# Patient Record
Sex: Female | Born: 1935
Health system: Southern US, Community
[De-identification: ages and names within clinical notes are randomized; demographics above are authoritative.]

---

## 2011-05-24 DIAGNOSIS — I10 Essential (primary) hypertension: Secondary | ICD-10-CM | POA: Diagnosis not present

## 2011-05-24 DIAGNOSIS — I059 Rheumatic mitral valve disease, unspecified: Secondary | ICD-10-CM | POA: Diagnosis not present

## 2011-05-24 DIAGNOSIS — E785 Hyperlipidemia, unspecified: Secondary | ICD-10-CM | POA: Diagnosis not present

## 2011-05-24 DIAGNOSIS — I6529 Occlusion and stenosis of unspecified carotid artery: Secondary | ICD-10-CM | POA: Diagnosis not present

## 2011-06-01 DIAGNOSIS — I6529 Occlusion and stenosis of unspecified carotid artery: Secondary | ICD-10-CM | POA: Diagnosis not present

## 2011-06-14 DIAGNOSIS — I10 Essential (primary) hypertension: Secondary | ICD-10-CM | POA: Diagnosis not present

## 2011-06-14 DIAGNOSIS — Z0000 Encounter for general adult medical examination without abnormal findings: Secondary | ICD-10-CM | POA: Diagnosis not present

## 2011-06-14 DIAGNOSIS — E7800 Pure hypercholesterolemia, unspecified: Secondary | ICD-10-CM | POA: Diagnosis not present

## 2011-06-14 DIAGNOSIS — H9190 Unspecified hearing loss, unspecified ear: Secondary | ICD-10-CM | POA: Diagnosis not present

## 2011-06-22 DIAGNOSIS — N390 Urinary tract infection, site not specified: Secondary | ICD-10-CM | POA: Diagnosis not present

## 2011-06-22 DIAGNOSIS — E785 Hyperlipidemia, unspecified: Secondary | ICD-10-CM | POA: Diagnosis not present

## 2011-06-22 DIAGNOSIS — Z79899 Other long term (current) drug therapy: Secondary | ICD-10-CM | POA: Diagnosis not present

## 2011-06-22 DIAGNOSIS — E559 Vitamin D deficiency, unspecified: Secondary | ICD-10-CM | POA: Diagnosis not present

## 2011-06-22 DIAGNOSIS — I10 Essential (primary) hypertension: Secondary | ICD-10-CM | POA: Diagnosis not present

## 2011-06-22 DIAGNOSIS — R110 Nausea: Secondary | ICD-10-CM | POA: Diagnosis not present

## 2011-06-22 DIAGNOSIS — R630 Anorexia: Secondary | ICD-10-CM | POA: Diagnosis not present

## 2011-06-22 DIAGNOSIS — R5381 Other malaise: Secondary | ICD-10-CM | POA: Diagnosis not present

## 2011-06-26 DIAGNOSIS — R5383 Other fatigue: Secondary | ICD-10-CM | POA: Diagnosis not present

## 2011-06-26 DIAGNOSIS — R5381 Other malaise: Secondary | ICD-10-CM | POA: Diagnosis not present

## 2011-06-26 DIAGNOSIS — R3915 Urgency of urination: Secondary | ICD-10-CM | POA: Diagnosis not present

## 2011-06-26 DIAGNOSIS — I10 Essential (primary) hypertension: Secondary | ICD-10-CM | POA: Diagnosis not present

## 2011-07-06 DIAGNOSIS — Z961 Presence of intraocular lens: Secondary | ICD-10-CM | POA: Diagnosis not present

## 2011-07-06 DIAGNOSIS — H353290 Exudative age-related macular degeneration, unspecified eye, stage unspecified: Secondary | ICD-10-CM | POA: Diagnosis not present

## 2012-10-24 DIAGNOSIS — F17200 Nicotine dependence, unspecified, uncomplicated: Secondary | ICD-10-CM | POA: Diagnosis not present

## 2012-10-24 DIAGNOSIS — R413 Other amnesia: Secondary | ICD-10-CM | POA: Diagnosis not present

## 2012-10-24 DIAGNOSIS — E785 Hyperlipidemia, unspecified: Secondary | ICD-10-CM | POA: Diagnosis not present

## 2012-10-24 DIAGNOSIS — R0989 Other specified symptoms and signs involving the circulatory and respiratory systems: Secondary | ICD-10-CM | POA: Diagnosis not present

## 2012-10-24 DIAGNOSIS — Z681 Body mass index (BMI) 19 or less, adult: Secondary | ICD-10-CM | POA: Diagnosis not present

## 2012-10-24 DIAGNOSIS — I10 Essential (primary) hypertension: Secondary | ICD-10-CM | POA: Diagnosis not present

## 2012-10-24 DIAGNOSIS — Z1331 Encounter for screening for depression: Secondary | ICD-10-CM | POA: Diagnosis not present

## 2012-10-24 DIAGNOSIS — H3530 Unspecified macular degeneration: Secondary | ICD-10-CM | POA: Diagnosis not present

## 2012-10-30 ENCOUNTER — Other Ambulatory Visit: Payer: Self-pay

## 2012-11-01 ENCOUNTER — Other Ambulatory Visit (INDEPENDENT_AMBULATORY_CARE_PROVIDER_SITE_OTHER): Payer: Medicare Other | Admitting: Vascular Surgery

## 2012-11-01 DIAGNOSIS — R0989 Other specified symptoms and signs involving the circulatory and respiratory systems: Secondary | ICD-10-CM

## 2012-11-04 ENCOUNTER — Encounter: Payer: Self-pay | Admitting: Internal Medicine

## 2012-12-17 DIAGNOSIS — G3184 Mild cognitive impairment, so stated: Secondary | ICD-10-CM | POA: Diagnosis not present

## 2012-12-17 DIAGNOSIS — R413 Other amnesia: Secondary | ICD-10-CM | POA: Diagnosis not present

## 2012-12-24 ENCOUNTER — Other Ambulatory Visit: Payer: Self-pay | Admitting: *Deleted

## 2012-12-24 DIAGNOSIS — G3184 Mild cognitive impairment, so stated: Secondary | ICD-10-CM

## 2012-12-24 DIAGNOSIS — R413 Other amnesia: Secondary | ICD-10-CM

## 2012-12-26 ENCOUNTER — Ambulatory Visit
Admission: RE | Admit: 2012-12-26 | Discharge: 2012-12-26 | Disposition: A | Discharge status: Home / Self Care | Payer: Medicare Other | Source: Ambulatory Visit | Attending: *Deleted | Admitting: *Deleted

## 2012-12-26 DIAGNOSIS — R413 Other amnesia: Secondary | ICD-10-CM

## 2012-12-26 DIAGNOSIS — G3184 Mild cognitive impairment, so stated: Secondary | ICD-10-CM

## 2013-01-21 DIAGNOSIS — R413 Other amnesia: Secondary | ICD-10-CM | POA: Diagnosis not present

## 2013-01-21 DIAGNOSIS — G3184 Mild cognitive impairment, so stated: Secondary | ICD-10-CM | POA: Diagnosis not present

## 2013-01-21 DIAGNOSIS — I10 Essential (primary) hypertension: Secondary | ICD-10-CM | POA: Diagnosis not present

## 2013-01-21 DIAGNOSIS — R4181 Age-related cognitive decline: Secondary | ICD-10-CM | POA: Diagnosis not present

## 2013-01-21 DIAGNOSIS — E7800 Pure hypercholesterolemia, unspecified: Secondary | ICD-10-CM | POA: Diagnosis not present

## 2013-01-21 DIAGNOSIS — F17200 Nicotine dependence, unspecified, uncomplicated: Secondary | ICD-10-CM | POA: Diagnosis not present

## 2013-04-10 DIAGNOSIS — R82998 Other abnormal findings in urine: Secondary | ICD-10-CM | POA: Diagnosis not present

## 2013-04-10 DIAGNOSIS — R809 Proteinuria, unspecified: Secondary | ICD-10-CM | POA: Diagnosis not present

## 2013-04-10 DIAGNOSIS — I10 Essential (primary) hypertension: Secondary | ICD-10-CM | POA: Diagnosis not present

## 2013-04-10 DIAGNOSIS — E785 Hyperlipidemia, unspecified: Secondary | ICD-10-CM | POA: Diagnosis not present

## 2013-04-16 DIAGNOSIS — I10 Essential (primary) hypertension: Secondary | ICD-10-CM | POA: Diagnosis not present

## 2013-04-16 DIAGNOSIS — Z23 Encounter for immunization: Secondary | ICD-10-CM | POA: Diagnosis not present

## 2013-04-16 DIAGNOSIS — Z0000 Encounter for general adult medical examination without abnormal findings: Secondary | ICD-10-CM | POA: Diagnosis not present

## 2013-04-16 DIAGNOSIS — N183 Chronic kidney disease, stage 3 unspecified: Secondary | ICD-10-CM | POA: Diagnosis not present

## 2013-04-16 DIAGNOSIS — R413 Other amnesia: Secondary | ICD-10-CM | POA: Diagnosis not present

## 2013-04-16 DIAGNOSIS — F17200 Nicotine dependence, unspecified, uncomplicated: Secondary | ICD-10-CM | POA: Diagnosis not present

## 2013-04-16 DIAGNOSIS — H3530 Unspecified macular degeneration: Secondary | ICD-10-CM | POA: Diagnosis not present

## 2013-04-16 DIAGNOSIS — E785 Hyperlipidemia, unspecified: Secondary | ICD-10-CM | POA: Diagnosis not present

## 2013-07-29 DIAGNOSIS — F0390 Unspecified dementia without behavioral disturbance: Secondary | ICD-10-CM | POA: Diagnosis not present

## 2013-07-29 DIAGNOSIS — F17200 Nicotine dependence, unspecified, uncomplicated: Secondary | ICD-10-CM | POA: Diagnosis not present

## 2013-10-22 DIAGNOSIS — F0390 Unspecified dementia without behavioral disturbance: Secondary | ICD-10-CM | POA: Diagnosis not present

## 2013-10-22 DIAGNOSIS — E785 Hyperlipidemia, unspecified: Secondary | ICD-10-CM | POA: Diagnosis not present

## 2013-10-22 DIAGNOSIS — K219 Gastro-esophageal reflux disease without esophagitis: Secondary | ICD-10-CM | POA: Diagnosis not present

## 2013-10-22 DIAGNOSIS — I10 Essential (primary) hypertension: Secondary | ICD-10-CM | POA: Diagnosis not present

## 2013-10-28 DIAGNOSIS — N390 Urinary tract infection, site not specified: Secondary | ICD-10-CM | POA: Diagnosis not present

## 2013-10-28 DIAGNOSIS — E785 Hyperlipidemia, unspecified: Secondary | ICD-10-CM | POA: Diagnosis not present

## 2013-10-28 DIAGNOSIS — I10 Essential (primary) hypertension: Secondary | ICD-10-CM | POA: Diagnosis not present

## 2013-10-28 DIAGNOSIS — E559 Vitamin D deficiency, unspecified: Secondary | ICD-10-CM | POA: Diagnosis not present

## 2013-11-06 DIAGNOSIS — H353290 Exudative age-related macular degeneration, unspecified eye, stage unspecified: Secondary | ICD-10-CM | POA: Diagnosis not present

## 2013-11-06 DIAGNOSIS — H353190 Nonexudative age-related macular degeneration, unspecified eye, stage unspecified: Secondary | ICD-10-CM | POA: Diagnosis not present

## 2013-11-06 DIAGNOSIS — Z961 Presence of intraocular lens: Secondary | ICD-10-CM | POA: Diagnosis not present

## 2014-01-08 DIAGNOSIS — H3532 Exudative age-related macular degeneration: Secondary | ICD-10-CM | POA: Diagnosis not present

## 2014-01-08 DIAGNOSIS — H3531 Nonexudative age-related macular degeneration: Secondary | ICD-10-CM | POA: Diagnosis not present

## 2014-04-07 DIAGNOSIS — Z1389 Encounter for screening for other disorder: Secondary | ICD-10-CM | POA: Diagnosis not present

## 2014-04-07 DIAGNOSIS — Z23 Encounter for immunization: Secondary | ICD-10-CM | POA: Diagnosis not present

## 2014-04-07 DIAGNOSIS — Z681 Body mass index (BMI) 19 or less, adult: Secondary | ICD-10-CM | POA: Diagnosis not present

## 2014-04-07 DIAGNOSIS — N183 Chronic kidney disease, stage 3 (moderate): Secondary | ICD-10-CM | POA: Diagnosis not present

## 2014-04-07 DIAGNOSIS — I10 Essential (primary) hypertension: Secondary | ICD-10-CM | POA: Diagnosis not present

## 2014-04-07 DIAGNOSIS — F0280 Dementia in other diseases classified elsewhere without behavioral disturbance: Secondary | ICD-10-CM | POA: Diagnosis not present

## 2015-01-14 DIAGNOSIS — H905 Unspecified sensorineural hearing loss: Secondary | ICD-10-CM | POA: Diagnosis not present

## 2015-01-14 DIAGNOSIS — I10 Essential (primary) hypertension: Secondary | ICD-10-CM | POA: Diagnosis not present

## 2015-01-14 DIAGNOSIS — E785 Hyperlipidemia, unspecified: Secondary | ICD-10-CM | POA: Diagnosis not present

## 2015-01-14 DIAGNOSIS — R05 Cough: Secondary | ICD-10-CM | POA: Diagnosis not present

## 2015-01-14 DIAGNOSIS — F0390 Unspecified dementia without behavioral disturbance: Secondary | ICD-10-CM | POA: Diagnosis not present

## 2015-01-21 DIAGNOSIS — F17210 Nicotine dependence, cigarettes, uncomplicated: Secondary | ICD-10-CM | POA: Diagnosis not present

## 2015-01-21 DIAGNOSIS — R05 Cough: Secondary | ICD-10-CM | POA: Diagnosis not present

## 2015-02-15 DIAGNOSIS — E785 Hyperlipidemia, unspecified: Secondary | ICD-10-CM | POA: Diagnosis not present

## 2015-02-15 DIAGNOSIS — I10 Essential (primary) hypertension: Secondary | ICD-10-CM | POA: Diagnosis not present

## 2015-02-15 DIAGNOSIS — F0390 Unspecified dementia without behavioral disturbance: Secondary | ICD-10-CM | POA: Diagnosis not present

## 2015-02-22 DIAGNOSIS — H353210 Exudative age-related macular degeneration, right eye, stage unspecified: Secondary | ICD-10-CM | POA: Diagnosis not present

## 2015-02-22 DIAGNOSIS — H353120 Nonexudative age-related macular degeneration, left eye, stage unspecified: Secondary | ICD-10-CM | POA: Diagnosis not present

## 2015-07-20 DIAGNOSIS — D649 Anemia, unspecified: Secondary | ICD-10-CM | POA: Diagnosis not present

## 2015-07-20 DIAGNOSIS — F0390 Unspecified dementia without behavioral disturbance: Secondary | ICD-10-CM | POA: Diagnosis not present

## 2015-07-20 DIAGNOSIS — E785 Hyperlipidemia, unspecified: Secondary | ICD-10-CM | POA: Diagnosis not present

## 2015-07-20 DIAGNOSIS — I10 Essential (primary) hypertension: Secondary | ICD-10-CM | POA: Diagnosis not present

## 2015-07-20 DIAGNOSIS — H905 Unspecified sensorineural hearing loss: Secondary | ICD-10-CM | POA: Diagnosis not present

## 2015-07-25 IMAGING — CT CT HEAD W/O CM
2 series · 16 of 30 positions shown, 18 images · non-contrast
Comparison: None.

CLINICAL DATA: Six-month history of short-term memory loss.
Previous history of encephalitis. No history of head trauma.

EXAM:
CT HEAD WITHOUT CONTRAST
TECHNIQUE: Contiguous axial images were obtained from the base of the skull
through the vertex without intravenous contrast.

[Series 3: head bone · axial · 0.43mm/px · z∈[+22,+135]mm · 8 of 56 slices shown]
[im 6/56  bone]
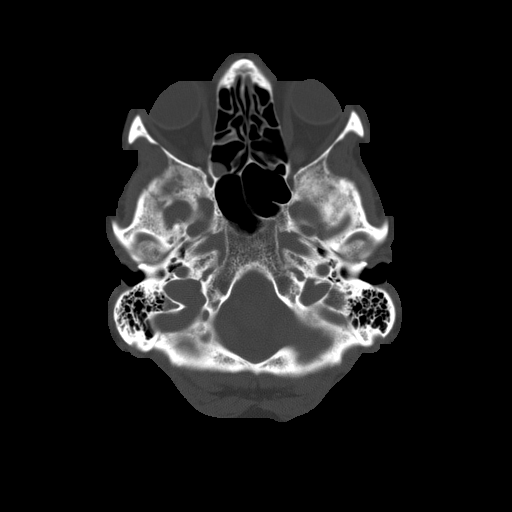
[im 12/56  bone]
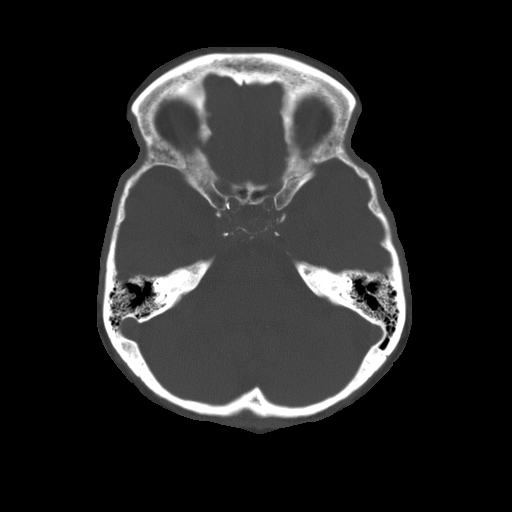
[im 18/56  bone]
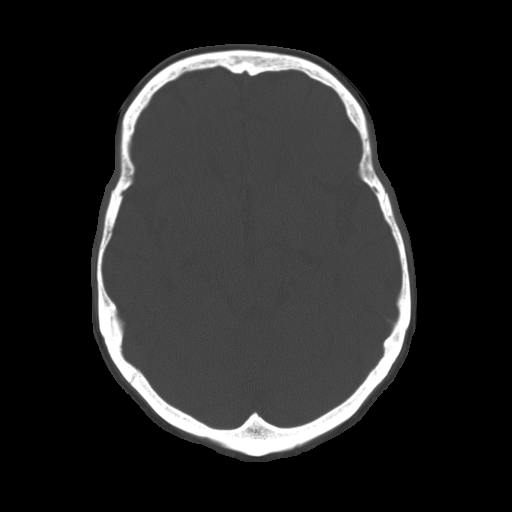
[im 24/56  bone]
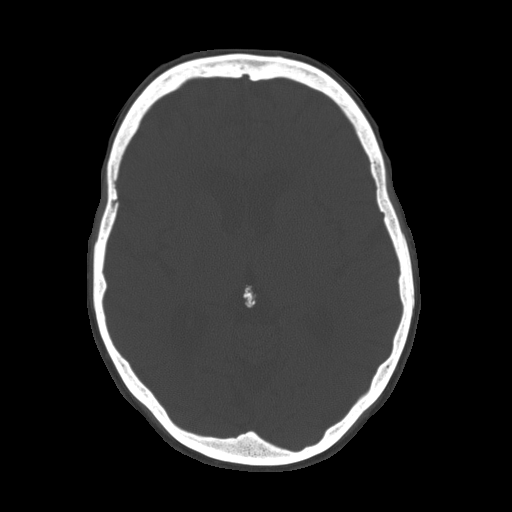
[im 32/56  bone]
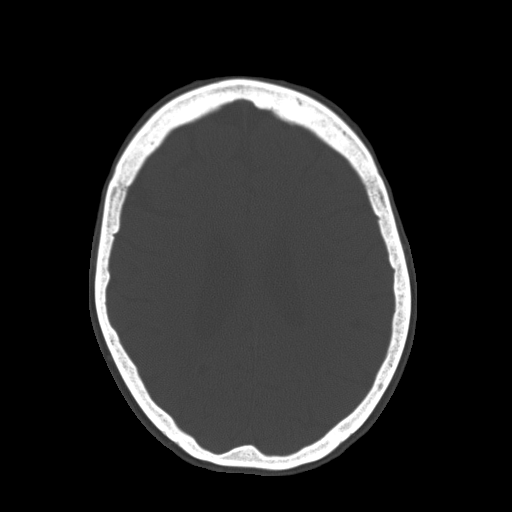
[im 38/56  bone]
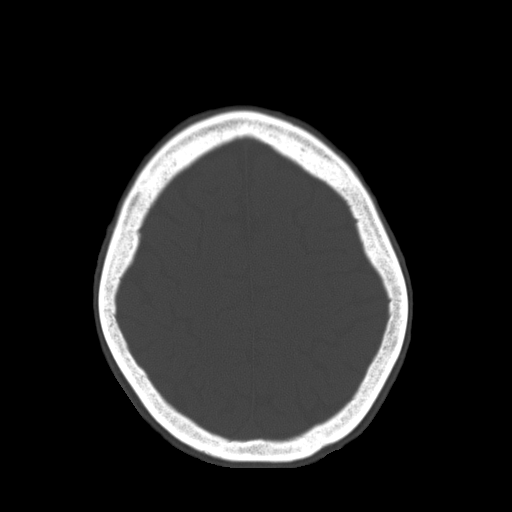
[im 44/56  bone]
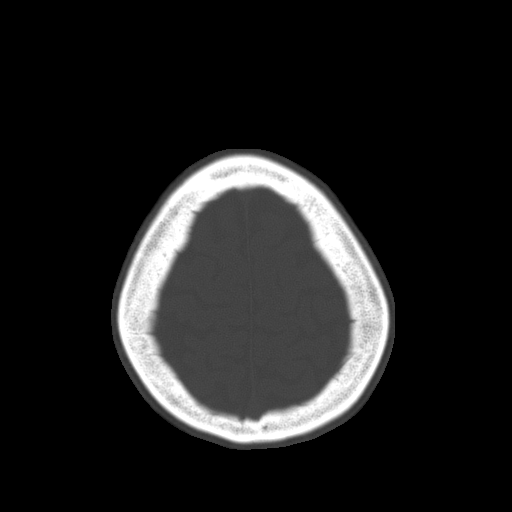
[im 50/56  bone]
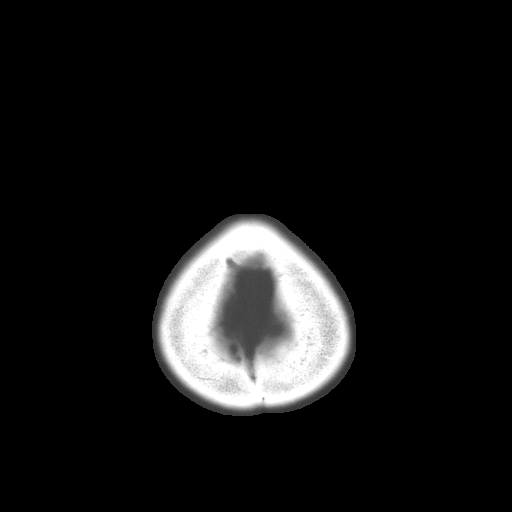

[Series 32: 3d filtered head w/o · axial · non-contrast · 0.43mm/px · z∈[+25,+134]mm · 8 of 28 slices shown, 10 images]
[im 4/28  brain]
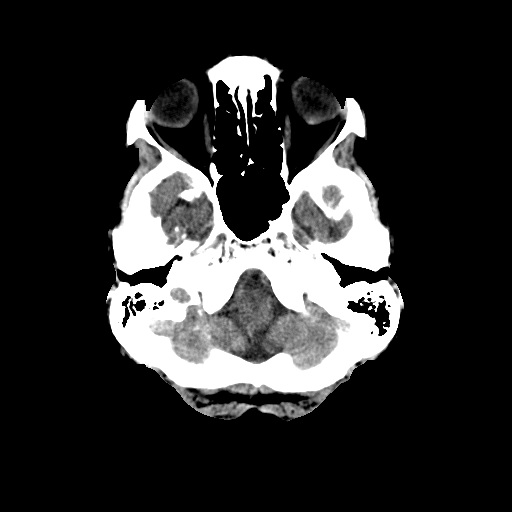
[im 4/28  bone]
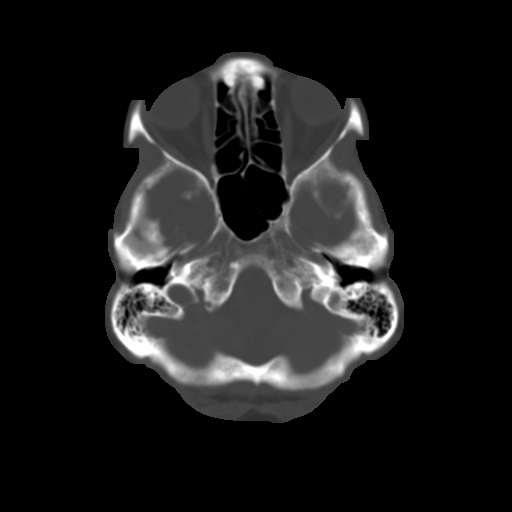
[im 7/28  brain]
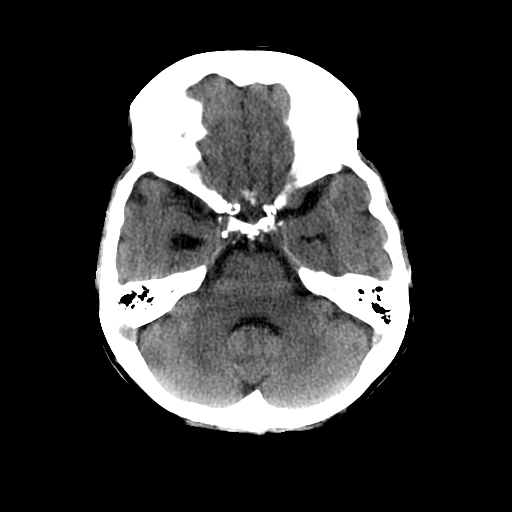
[im 10/28  brain]
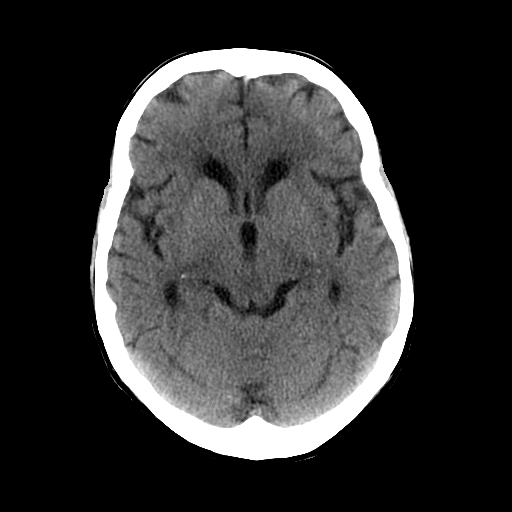
[im 13/28  brain]
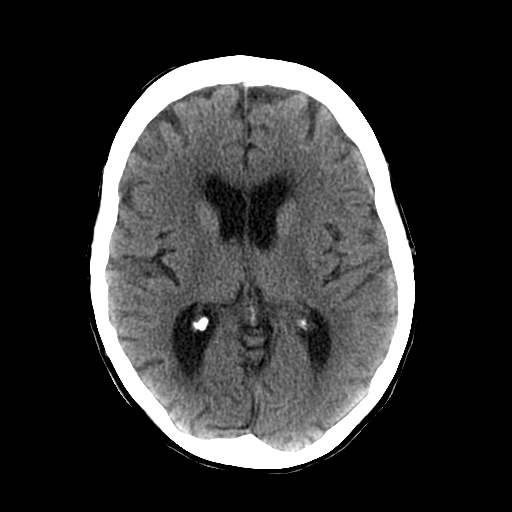
[im 16/28  brain]
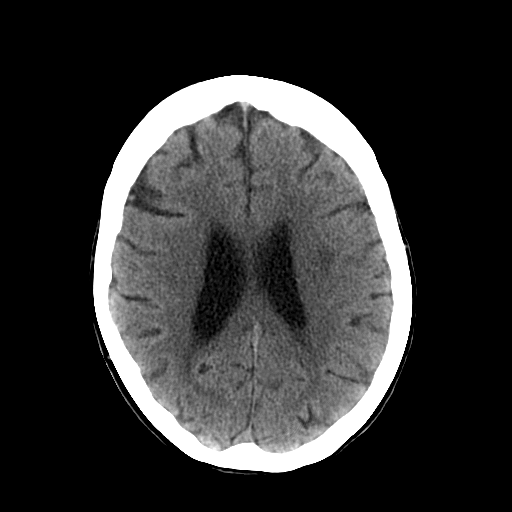
[im 16/28  bone]
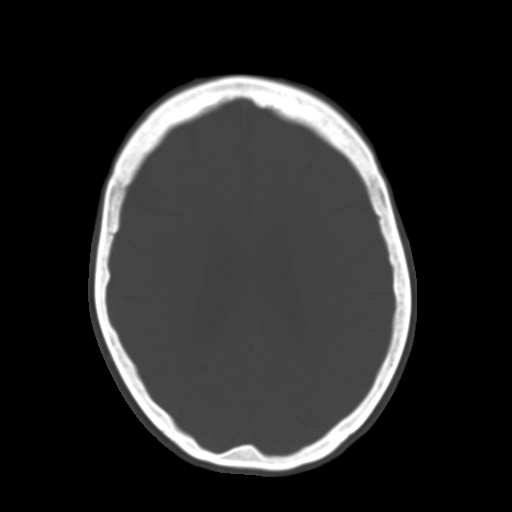
[im 19/28  brain]
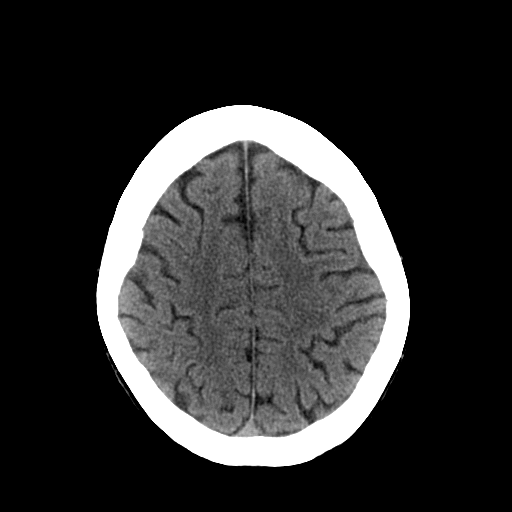
[im 22/28  brain]
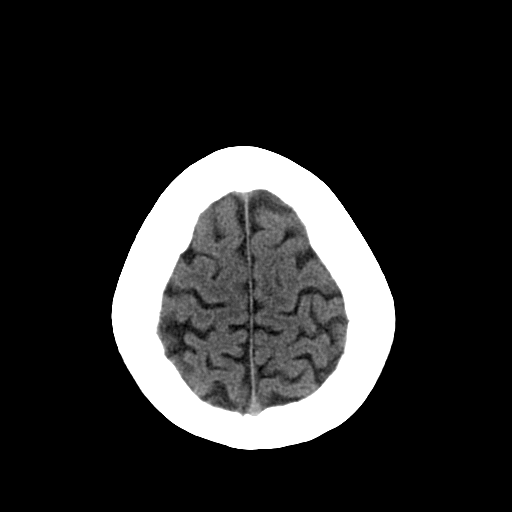
[im 25/28  brain]
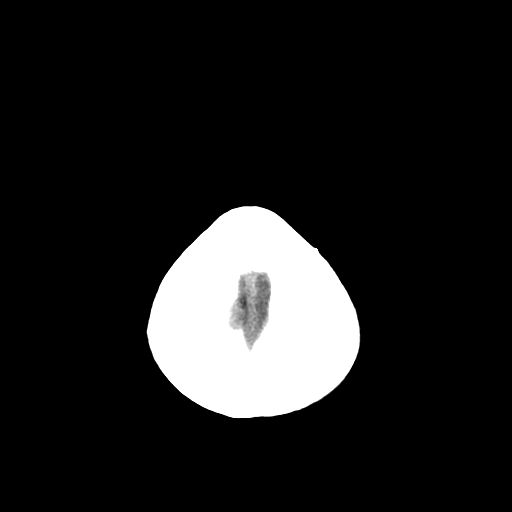

[16 of 30 positions shown; findings below may reference images not displayed]

FINDINGS: There is no evidence of brain mass, brain hemorrhage, or acute
infarction.

There is no evidence of shift of midline structures or subdural or
epidural hematoma.

There is prominence of the fissures and slight ventricular
enlargement consistent with atrophy. No obstructive hydrocephalus is
evident.

Minimal periventricular and corona radiata white matter
hypodensities are seen consistent with minimal white matter
degeneration most likely representing chronic microvascular ischemic
changes.

The calvarium is intact.

Mastoids are well aerated.

There is some mucosal thickening within the ethmoid air cells on the
side. There is an oval density within the ethmoid air cells
posteriorly on the right. This density is consistent with a small
mucous retention cyst or polyp. No air-fluid levels are seen. No
sinus expansion or bony destruction is seen.
IMPRESSION: No evidence of brain mass, brain hemorrhage, or acute infarction.

There is prominence of the fissures and slight ventricular
enlargement consistent with atrophy. No obstructive hydrocephalus is
evident.

Minimal periventricular and corona radiata white matter
hypodensities are seen consistent with minimal white matter
degeneration most likely representing chronic microvascular ischemic
changes.

## 2015-09-07 DIAGNOSIS — E785 Hyperlipidemia, unspecified: Secondary | ICD-10-CM | POA: Diagnosis not present

## 2015-09-07 DIAGNOSIS — D649 Anemia, unspecified: Secondary | ICD-10-CM | POA: Diagnosis not present

## 2015-09-07 DIAGNOSIS — F0390 Unspecified dementia without behavioral disturbance: Secondary | ICD-10-CM | POA: Diagnosis not present

## 2015-09-20 DIAGNOSIS — D62 Acute posthemorrhagic anemia: Secondary | ICD-10-CM | POA: Diagnosis not present

## 2015-09-20 DIAGNOSIS — F0390 Unspecified dementia without behavioral disturbance: Secondary | ICD-10-CM | POA: Diagnosis not present

## 2015-09-20 DIAGNOSIS — I10 Essential (primary) hypertension: Secondary | ICD-10-CM | POA: Diagnosis not present

## 2015-09-20 DIAGNOSIS — K922 Gastrointestinal hemorrhage, unspecified: Secondary | ICD-10-CM | POA: Diagnosis not present

## 2015-09-20 DIAGNOSIS — K254 Chronic or unspecified gastric ulcer with hemorrhage: Secondary | ICD-10-CM | POA: Diagnosis not present

## 2015-09-20 DIAGNOSIS — E785 Hyperlipidemia, unspecified: Secondary | ICD-10-CM | POA: Diagnosis not present

## 2015-09-20 DIAGNOSIS — Z79899 Other long term (current) drug therapy: Secondary | ICD-10-CM | POA: Diagnosis not present

## 2015-09-20 DIAGNOSIS — F17210 Nicotine dependence, cigarettes, uncomplicated: Secondary | ICD-10-CM | POA: Diagnosis not present

## 2015-09-20 DIAGNOSIS — Z7982 Long term (current) use of aspirin: Secondary | ICD-10-CM | POA: Diagnosis not present

## 2015-09-20 DIAGNOSIS — E871 Hypo-osmolality and hyponatremia: Secondary | ICD-10-CM | POA: Diagnosis not present

## 2015-09-20 DIAGNOSIS — R7989 Other specified abnormal findings of blood chemistry: Secondary | ICD-10-CM | POA: Diagnosis not present

## 2015-09-21 DIAGNOSIS — Z7982 Long term (current) use of aspirin: Secondary | ICD-10-CM | POA: Diagnosis not present

## 2015-09-21 DIAGNOSIS — I10 Essential (primary) hypertension: Secondary | ICD-10-CM | POA: Diagnosis present

## 2015-09-21 DIAGNOSIS — K254 Chronic or unspecified gastric ulcer with hemorrhage: Secondary | ICD-10-CM | POA: Diagnosis present

## 2015-09-21 DIAGNOSIS — K921 Melena: Secondary | ICD-10-CM | POA: Diagnosis not present

## 2015-09-21 DIAGNOSIS — D131 Benign neoplasm of stomach: Secondary | ICD-10-CM | POA: Diagnosis not present

## 2015-09-21 DIAGNOSIS — Z79899 Other long term (current) drug therapy: Secondary | ICD-10-CM | POA: Diagnosis not present

## 2015-09-21 DIAGNOSIS — D62 Acute posthemorrhagic anemia: Secondary | ICD-10-CM | POA: Diagnosis present

## 2015-09-21 DIAGNOSIS — R7989 Other specified abnormal findings of blood chemistry: Secondary | ICD-10-CM | POA: Diagnosis not present

## 2015-09-21 DIAGNOSIS — F17210 Nicotine dependence, cigarettes, uncomplicated: Secondary | ICD-10-CM | POA: Diagnosis present

## 2015-09-21 DIAGNOSIS — D500 Iron deficiency anemia secondary to blood loss (chronic): Secondary | ICD-10-CM | POA: Diagnosis not present

## 2015-09-21 DIAGNOSIS — D649 Anemia, unspecified: Secondary | ICD-10-CM | POA: Diagnosis not present

## 2015-09-21 DIAGNOSIS — E871 Hypo-osmolality and hyponatremia: Secondary | ICD-10-CM | POA: Diagnosis present

## 2015-09-21 DIAGNOSIS — F0390 Unspecified dementia without behavioral disturbance: Secondary | ICD-10-CM | POA: Diagnosis present

## 2015-09-21 DIAGNOSIS — E785 Hyperlipidemia, unspecified: Secondary | ICD-10-CM | POA: Diagnosis not present

## 2015-09-21 DIAGNOSIS — K922 Gastrointestinal hemorrhage, unspecified: Secondary | ICD-10-CM | POA: Diagnosis not present

## 2015-09-22 DIAGNOSIS — I10 Essential (primary) hypertension: Secondary | ICD-10-CM | POA: Diagnosis not present

## 2015-09-22 DIAGNOSIS — D500 Iron deficiency anemia secondary to blood loss (chronic): Secondary | ICD-10-CM | POA: Diagnosis not present

## 2015-09-22 DIAGNOSIS — F17210 Nicotine dependence, cigarettes, uncomplicated: Secondary | ICD-10-CM | POA: Diagnosis not present

## 2015-09-22 DIAGNOSIS — F0390 Unspecified dementia without behavioral disturbance: Secondary | ICD-10-CM | POA: Diagnosis not present

## 2015-09-22 DIAGNOSIS — K254 Chronic or unspecified gastric ulcer with hemorrhage: Secondary | ICD-10-CM | POA: Diagnosis not present

## 2015-09-22 DIAGNOSIS — K922 Gastrointestinal hemorrhage, unspecified: Secondary | ICD-10-CM | POA: Diagnosis not present

## 2015-09-22 DIAGNOSIS — D649 Anemia, unspecified: Secondary | ICD-10-CM | POA: Diagnosis not present

## 2015-09-22 DIAGNOSIS — D62 Acute posthemorrhagic anemia: Secondary | ICD-10-CM | POA: Diagnosis not present

## 2015-09-22 DIAGNOSIS — K921 Melena: Secondary | ICD-10-CM | POA: Diagnosis not present

## 2015-09-22 DIAGNOSIS — E871 Hypo-osmolality and hyponatremia: Secondary | ICD-10-CM | POA: Diagnosis not present

## 2015-09-22 DIAGNOSIS — D131 Benign neoplasm of stomach: Secondary | ICD-10-CM | POA: Diagnosis not present

## 2015-10-01 DIAGNOSIS — D509 Iron deficiency anemia, unspecified: Secondary | ICD-10-CM | POA: Diagnosis not present

## 2015-10-13 DIAGNOSIS — E785 Hyperlipidemia, unspecified: Secondary | ICD-10-CM | POA: Diagnosis not present

## 2015-10-13 DIAGNOSIS — I10 Essential (primary) hypertension: Secondary | ICD-10-CM | POA: Diagnosis not present

## 2015-10-13 DIAGNOSIS — D500 Iron deficiency anemia secondary to blood loss (chronic): Secondary | ICD-10-CM | POA: Diagnosis not present

## 2015-10-13 DIAGNOSIS — F0390 Unspecified dementia without behavioral disturbance: Secondary | ICD-10-CM | POA: Diagnosis not present

## 2015-10-13 DIAGNOSIS — R05 Cough: Secondary | ICD-10-CM | POA: Diagnosis not present

## 2015-11-04 DIAGNOSIS — D500 Iron deficiency anemia secondary to blood loss (chronic): Secondary | ICD-10-CM | POA: Diagnosis not present

## 2015-11-09 DIAGNOSIS — K219 Gastro-esophageal reflux disease without esophagitis: Secondary | ICD-10-CM | POA: Diagnosis not present

## 2015-11-09 DIAGNOSIS — D509 Iron deficiency anemia, unspecified: Secondary | ICD-10-CM | POA: Diagnosis not present

## 2016-01-04 DIAGNOSIS — D509 Iron deficiency anemia, unspecified: Secondary | ICD-10-CM | POA: Diagnosis not present

## 2016-01-04 DIAGNOSIS — K922 Gastrointestinal hemorrhage, unspecified: Secondary | ICD-10-CM | POA: Diagnosis not present

## 2016-01-07 DIAGNOSIS — K31811 Angiodysplasia of stomach and duodenum with bleeding: Secondary | ICD-10-CM | POA: Diagnosis not present

## 2016-01-07 DIAGNOSIS — K449 Diaphragmatic hernia without obstruction or gangrene: Secondary | ICD-10-CM | POA: Diagnosis not present

## 2016-01-07 DIAGNOSIS — K922 Gastrointestinal hemorrhage, unspecified: Secondary | ICD-10-CM | POA: Diagnosis not present

## 2016-01-07 DIAGNOSIS — D123 Benign neoplasm of transverse colon: Secondary | ICD-10-CM | POA: Diagnosis not present

## 2016-01-07 DIAGNOSIS — R195 Other fecal abnormalities: Secondary | ICD-10-CM | POA: Diagnosis not present

## 2016-01-07 DIAGNOSIS — D126 Benign neoplasm of colon, unspecified: Secondary | ICD-10-CM | POA: Diagnosis not present

## 2016-01-07 DIAGNOSIS — K621 Rectal polyp: Secondary | ICD-10-CM | POA: Diagnosis not present

## 2016-01-07 DIAGNOSIS — D125 Benign neoplasm of sigmoid colon: Secondary | ICD-10-CM | POA: Diagnosis not present

## 2016-01-07 DIAGNOSIS — D122 Benign neoplasm of ascending colon: Secondary | ICD-10-CM | POA: Diagnosis not present

## 2016-01-07 DIAGNOSIS — D120 Benign neoplasm of cecum: Secondary | ICD-10-CM | POA: Diagnosis not present

## 2016-01-07 DIAGNOSIS — D509 Iron deficiency anemia, unspecified: Secondary | ICD-10-CM | POA: Diagnosis not present

## 2016-01-07 DIAGNOSIS — K2951 Unspecified chronic gastritis with bleeding: Secondary | ICD-10-CM | POA: Diagnosis not present

## 2016-02-10 DIAGNOSIS — D500 Iron deficiency anemia secondary to blood loss (chronic): Secondary | ICD-10-CM | POA: Diagnosis not present

## 2016-02-14 DIAGNOSIS — K922 Gastrointestinal hemorrhage, unspecified: Secondary | ICD-10-CM | POA: Diagnosis not present

## 2016-02-14 DIAGNOSIS — Z86010 Personal history of colonic polyps: Secondary | ICD-10-CM | POA: Diagnosis not present

## 2016-02-15 DIAGNOSIS — Z23 Encounter for immunization: Secondary | ICD-10-CM | POA: Diagnosis not present

## 2016-02-15 DIAGNOSIS — H3530 Unspecified macular degeneration: Secondary | ICD-10-CM | POA: Diagnosis not present

## 2016-02-15 DIAGNOSIS — I10 Essential (primary) hypertension: Secondary | ICD-10-CM | POA: Diagnosis not present

## 2016-02-15 DIAGNOSIS — E785 Hyperlipidemia, unspecified: Secondary | ICD-10-CM | POA: Diagnosis not present

## 2016-02-15 DIAGNOSIS — F0390 Unspecified dementia without behavioral disturbance: Secondary | ICD-10-CM | POA: Diagnosis not present

## 2016-02-15 DIAGNOSIS — D500 Iron deficiency anemia secondary to blood loss (chronic): Secondary | ICD-10-CM | POA: Diagnosis not present

## 2016-08-15 DIAGNOSIS — I10 Essential (primary) hypertension: Secondary | ICD-10-CM | POA: Diagnosis not present

## 2016-08-15 DIAGNOSIS — D649 Anemia, unspecified: Secondary | ICD-10-CM | POA: Diagnosis not present

## 2016-08-15 DIAGNOSIS — F0390 Unspecified dementia without behavioral disturbance: Secondary | ICD-10-CM | POA: Diagnosis not present

## 2016-08-15 DIAGNOSIS — H905 Unspecified sensorineural hearing loss: Secondary | ICD-10-CM | POA: Diagnosis not present

## 2016-08-15 DIAGNOSIS — N3281 Overactive bladder: Secondary | ICD-10-CM | POA: Diagnosis not present

## 2016-08-31 DIAGNOSIS — D649 Anemia, unspecified: Secondary | ICD-10-CM | POA: Diagnosis not present

## 2016-09-14 DIAGNOSIS — H353123 Nonexudative age-related macular degeneration, left eye, advanced atrophic without subfoveal involvement: Secondary | ICD-10-CM | POA: Diagnosis not present

## 2016-09-14 DIAGNOSIS — H353213 Exudative age-related macular degeneration, right eye, with inactive scar: Secondary | ICD-10-CM | POA: Diagnosis not present

## 2017-02-06 DIAGNOSIS — I10 Essential (primary) hypertension: Secondary | ICD-10-CM | POA: Diagnosis not present

## 2017-02-06 DIAGNOSIS — E7849 Other hyperlipidemia: Secondary | ICD-10-CM | POA: Diagnosis not present

## 2017-02-06 DIAGNOSIS — Z1389 Encounter for screening for other disorder: Secondary | ICD-10-CM | POA: Diagnosis not present

## 2017-02-06 DIAGNOSIS — Z681 Body mass index (BMI) 19 or less, adult: Secondary | ICD-10-CM | POA: Diagnosis not present

## 2017-02-06 DIAGNOSIS — F0280 Dementia in other diseases classified elsewhere without behavioral disturbance: Secondary | ICD-10-CM | POA: Diagnosis not present

## 2017-02-06 DIAGNOSIS — N183 Chronic kidney disease, stage 3 (moderate): Secondary | ICD-10-CM | POA: Diagnosis not present

## 2017-02-06 DIAGNOSIS — K648 Other hemorrhoids: Secondary | ICD-10-CM | POA: Diagnosis not present

## 2017-02-06 DIAGNOSIS — Z23 Encounter for immunization: Secondary | ICD-10-CM | POA: Diagnosis not present

## 2017-08-21 DIAGNOSIS — R82998 Other abnormal findings in urine: Secondary | ICD-10-CM | POA: Diagnosis not present

## 2017-08-21 DIAGNOSIS — E7849 Other hyperlipidemia: Secondary | ICD-10-CM | POA: Diagnosis not present

## 2017-08-21 DIAGNOSIS — I10 Essential (primary) hypertension: Secondary | ICD-10-CM | POA: Diagnosis not present

## 2017-08-27 DIAGNOSIS — Z0000 Encounter for general adult medical examination without abnormal findings: Secondary | ICD-10-CM | POA: Diagnosis not present

## 2017-08-27 DIAGNOSIS — F0280 Dementia in other diseases classified elsewhere without behavioral disturbance: Secondary | ICD-10-CM | POA: Diagnosis not present

## 2017-08-27 DIAGNOSIS — Z681 Body mass index (BMI) 19 or less, adult: Secondary | ICD-10-CM | POA: Diagnosis not present

## 2017-08-27 DIAGNOSIS — I10 Essential (primary) hypertension: Secondary | ICD-10-CM | POA: Diagnosis not present

## 2017-08-27 DIAGNOSIS — K649 Unspecified hemorrhoids: Secondary | ICD-10-CM | POA: Diagnosis not present

## 2017-08-27 DIAGNOSIS — R0989 Other specified symptoms and signs involving the circulatory and respiratory systems: Secondary | ICD-10-CM | POA: Diagnosis not present

## 2017-08-27 DIAGNOSIS — N183 Chronic kidney disease, stage 3 (moderate): Secondary | ICD-10-CM | POA: Diagnosis not present

## 2017-08-27 DIAGNOSIS — E7849 Other hyperlipidemia: Secondary | ICD-10-CM | POA: Diagnosis not present

## 2017-08-27 DIAGNOSIS — Z23 Encounter for immunization: Secondary | ICD-10-CM | POA: Diagnosis not present

## 2017-08-27 DIAGNOSIS — Z1389 Encounter for screening for other disorder: Secondary | ICD-10-CM | POA: Diagnosis not present

## 2017-09-04 ENCOUNTER — Ambulatory Visit (HOSPITAL_COMMUNITY)
Admission: RE | Admit: 2017-09-04 | Discharge: 2017-09-04 | Disposition: A | Discharge status: Home / Self Care | Payer: Medicare Other | Source: Ambulatory Visit | Attending: Vascular Surgery | Admitting: Vascular Surgery

## 2017-09-04 ENCOUNTER — Other Ambulatory Visit (HOSPITAL_COMMUNITY): Payer: Self-pay | Admitting: Internal Medicine

## 2017-09-04 DIAGNOSIS — I6523 Occlusion and stenosis of bilateral carotid arteries: Secondary | ICD-10-CM | POA: Diagnosis not present

## 2017-09-04 DIAGNOSIS — I6509 Occlusion and stenosis of unspecified vertebral artery: Secondary | ICD-10-CM | POA: Diagnosis not present

## 2017-12-03 DIAGNOSIS — H353134 Nonexudative age-related macular degeneration, bilateral, advanced atrophic with subfoveal involvement: Secondary | ICD-10-CM | POA: Diagnosis not present

## 2018-02-28 DIAGNOSIS — M7021 Olecranon bursitis, right elbow: Secondary | ICD-10-CM | POA: Diagnosis not present

## 2018-02-28 DIAGNOSIS — Z681 Body mass index (BMI) 19 or less, adult: Secondary | ICD-10-CM | POA: Diagnosis not present

## 2018-08-27 DIAGNOSIS — I10 Essential (primary) hypertension: Secondary | ICD-10-CM | POA: Diagnosis not present

## 2018-08-27 DIAGNOSIS — E7849 Other hyperlipidemia: Secondary | ICD-10-CM | POA: Diagnosis not present

## 2018-09-04 DIAGNOSIS — I10 Essential (primary) hypertension: Secondary | ICD-10-CM | POA: Diagnosis not present

## 2018-09-04 DIAGNOSIS — R82998 Other abnormal findings in urine: Secondary | ICD-10-CM | POA: Diagnosis not present

## 2018-09-06 DIAGNOSIS — F0280 Dementia in other diseases classified elsewhere without behavioral disturbance: Secondary | ICD-10-CM | POA: Diagnosis not present

## 2018-09-06 DIAGNOSIS — I129 Hypertensive chronic kidney disease with stage 1 through stage 4 chronic kidney disease, or unspecified chronic kidney disease: Secondary | ICD-10-CM | POA: Diagnosis not present

## 2018-09-06 DIAGNOSIS — N183 Chronic kidney disease, stage 3 (moderate): Secondary | ICD-10-CM | POA: Diagnosis not present

## 2018-09-06 DIAGNOSIS — E785 Hyperlipidemia, unspecified: Secondary | ICD-10-CM | POA: Diagnosis not present

## 2018-09-06 DIAGNOSIS — Z0000 Encounter for general adult medical examination without abnormal findings: Secondary | ICD-10-CM | POA: Diagnosis not present

## 2018-09-06 DIAGNOSIS — K649 Unspecified hemorrhoids: Secondary | ICD-10-CM | POA: Diagnosis not present

## 2018-09-06 DIAGNOSIS — Z1331 Encounter for screening for depression: Secondary | ICD-10-CM | POA: Diagnosis not present

## 2018-09-06 DIAGNOSIS — Z1339 Encounter for screening examination for other mental health and behavioral disorders: Secondary | ICD-10-CM | POA: Diagnosis not present

## 2018-09-06 DIAGNOSIS — F17200 Nicotine dependence, unspecified, uncomplicated: Secondary | ICD-10-CM | POA: Diagnosis not present

## 2018-09-06 DIAGNOSIS — H3530 Unspecified macular degeneration: Secondary | ICD-10-CM | POA: Diagnosis not present

## 2019-01-02 DIAGNOSIS — Z23 Encounter for immunization: Secondary | ICD-10-CM | POA: Diagnosis not present

## 2019-05-07 ENCOUNTER — Other Ambulatory Visit: Payer: Self-pay | Admitting: Internal Medicine

## 2019-05-07 ENCOUNTER — Other Ambulatory Visit: Payer: Self-pay

## 2019-05-07 ENCOUNTER — Ambulatory Visit
Admission: RE | Admit: 2019-05-07 | Discharge: 2019-05-07 | Disposition: A | Discharge status: Home / Self Care | Payer: Medicare Other | Source: Ambulatory Visit | Attending: Internal Medicine | Admitting: Internal Medicine

## 2019-05-07 DIAGNOSIS — R918 Other nonspecific abnormal finding of lung field: Secondary | ICD-10-CM

## 2019-05-08 ENCOUNTER — Other Ambulatory Visit: Payer: Self-pay | Admitting: Internal Medicine

## 2019-05-09 ENCOUNTER — Other Ambulatory Visit: Payer: Self-pay | Admitting: Internal Medicine

## 2019-05-09 DIAGNOSIS — R918 Other nonspecific abnormal finding of lung field: Secondary | ICD-10-CM

## 2019-05-13 ENCOUNTER — Other Ambulatory Visit: Payer: PRIVATE HEALTH INSURANCE

## 2019-05-19 ENCOUNTER — Other Ambulatory Visit (HOSPITAL_COMMUNITY): Payer: Self-pay | Admitting: Internal Medicine

## 2019-05-19 ENCOUNTER — Other Ambulatory Visit: Payer: Self-pay | Admitting: Internal Medicine

## 2019-05-19 DIAGNOSIS — R918 Other nonspecific abnormal finding of lung field: Secondary | ICD-10-CM

## 2019-05-23 ENCOUNTER — Telehealth (HOSPITAL_COMMUNITY): Payer: Self-pay | Admitting: Internal Medicine

## 2019-05-23 NOTE — Telephone Encounter (Signed)
05/23/19~DO NOT [facility], per patient's daughter Lenis Dickinson, per patient admitted to [facility] & Dr advised that she is dying & don't have much longer to live. MF

## 2019-05-26 DEATH — deceased

## 2019-05-27 ENCOUNTER — Encounter (HOSPITAL_COMMUNITY): Payer: Medicare Other

## 2019-05-27 ENCOUNTER — Encounter (HOSPITAL_COMMUNITY): Payer: Self-pay

## 2021-12-03 IMAGING — CT CT CHEST W/O CM
1 series · 14 of 34 positions shown, 18 images · non-contrast
Comparison: None.

CLINICAL DATA: Abnormal chest x-ray in doctor's office right lower
lobe. Coughing 2 weeks.

EXAM:
CT CHEST WITHOUT CONTRAST
TECHNIQUE: Multidetector CT imaging of the chest was performed following the
standard protocol without IV contrast.

[Series 2: chest w/(date) · axial · 0.55mm/px · z∈[-321,-51]mm · 14 of 159 slices shown, 18 images]
[im 12/159  mediastinal]
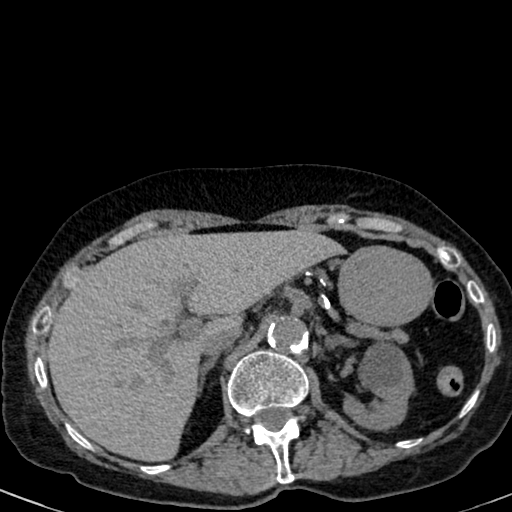
[im 12/159  lung]
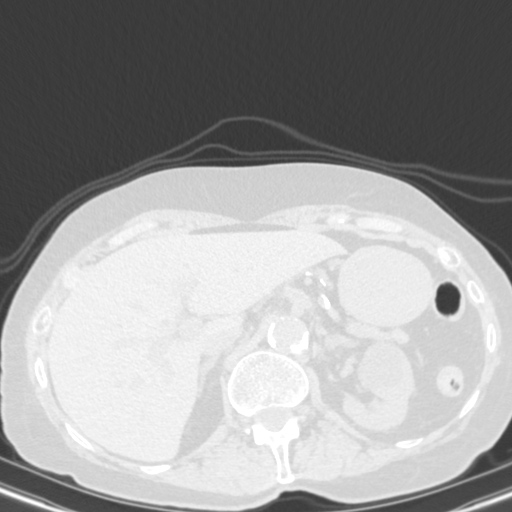
[im 24/159  lung]
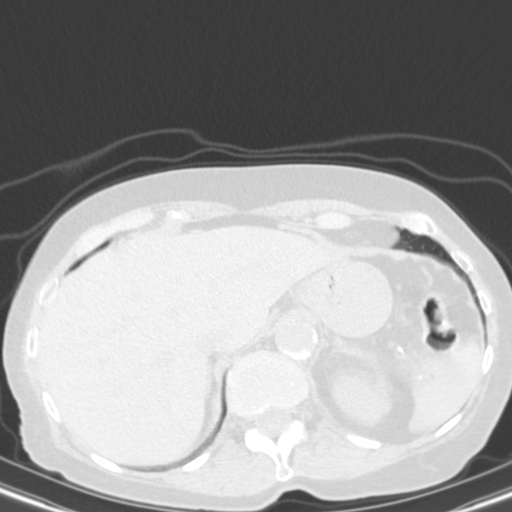
[im 32/159  lung]
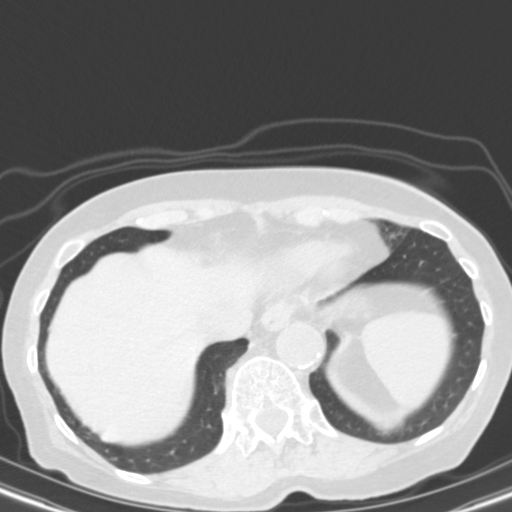
[im 47/159  lung]
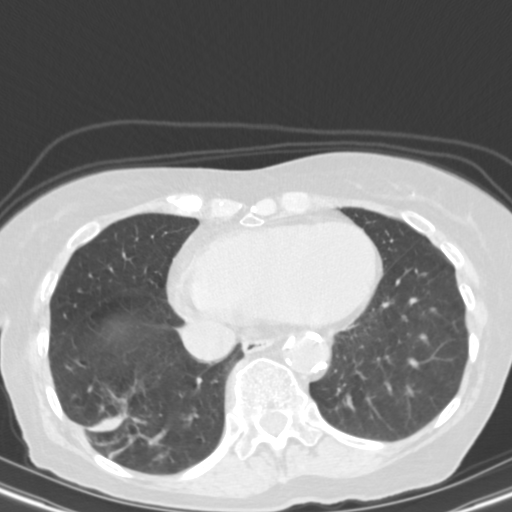
[im 59/159  mediastinal]
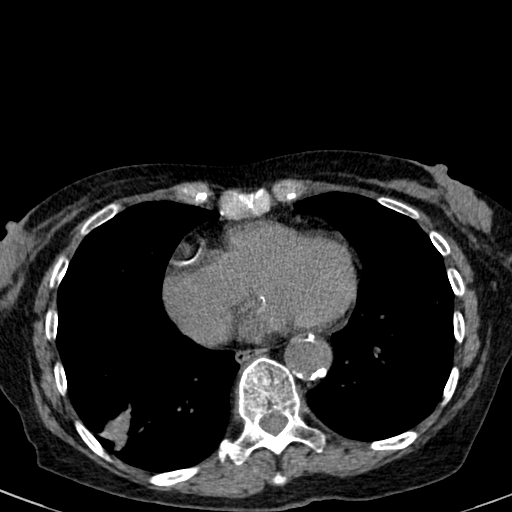
[im 59/159  lung]
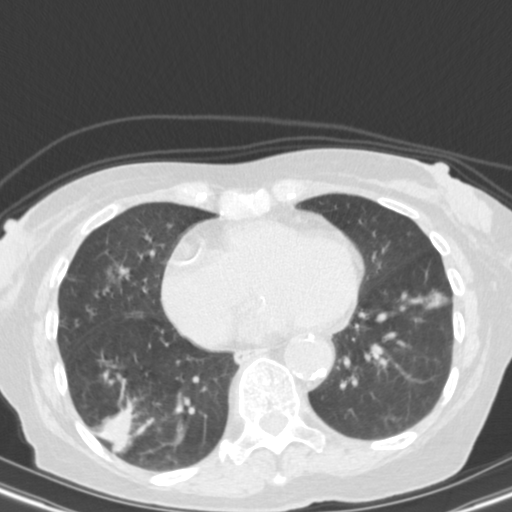
[im 65/159  lung]
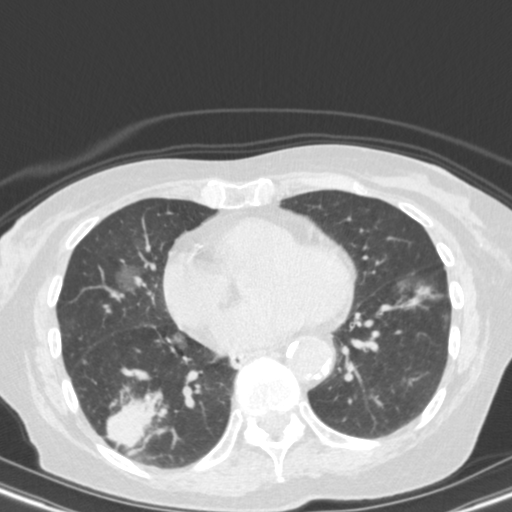
[im 75/159  lung]
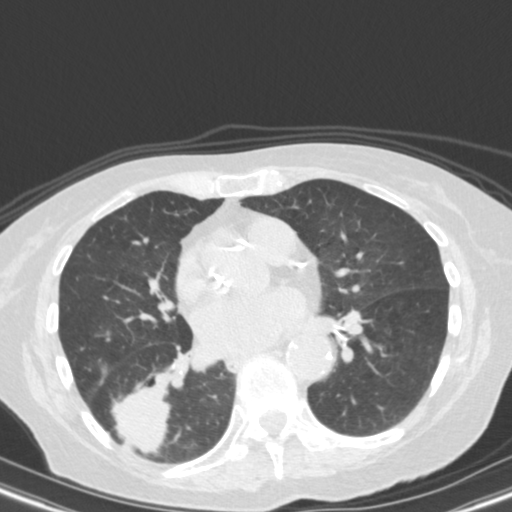
[im 85/159  lung]
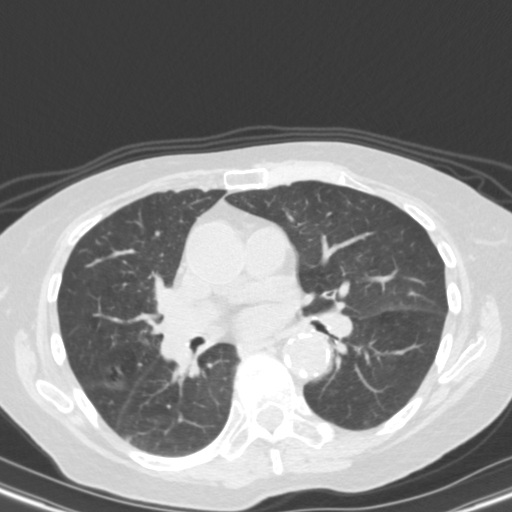
[im 94/159  mediastinal]
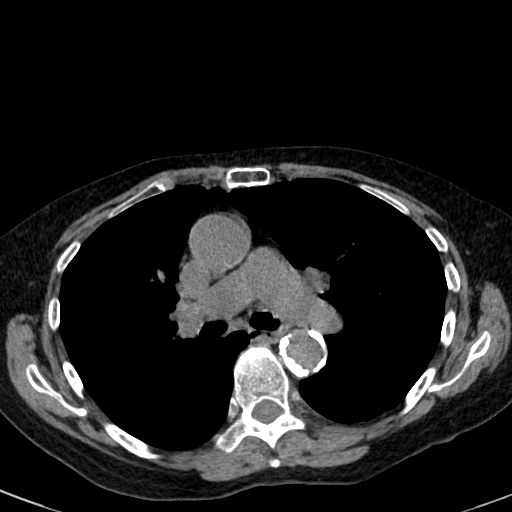
[im 94/159  lung]
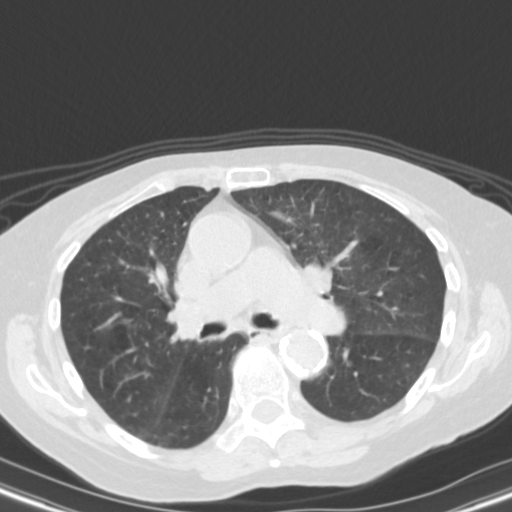
[im 100/159  lung]
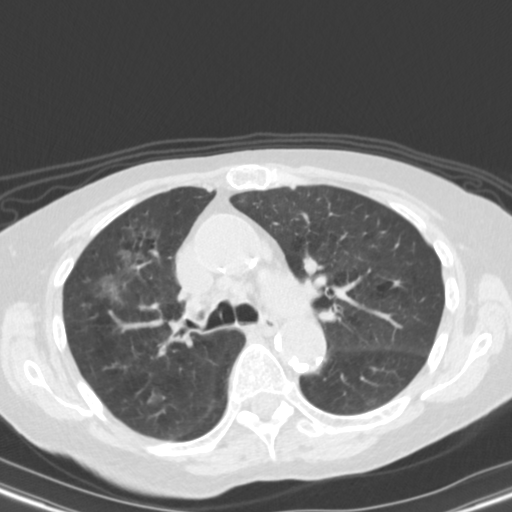
[im 118/159  lung]
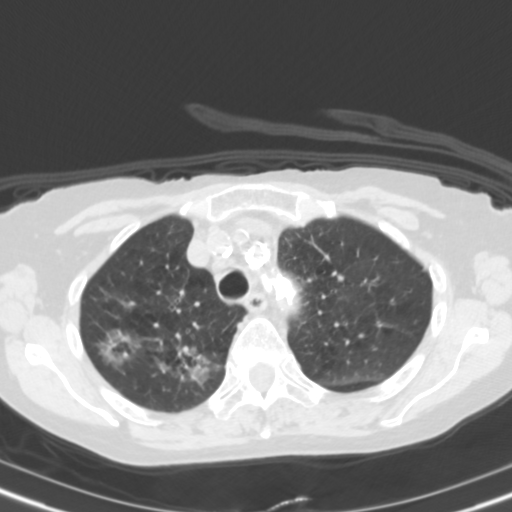
[im 127/159  lung]
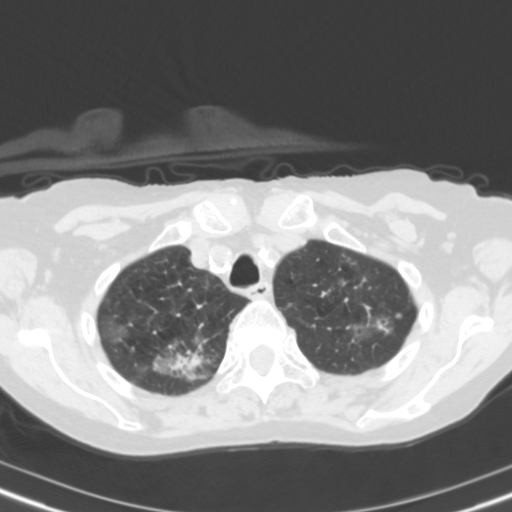
[im 135/159  mediastinal]
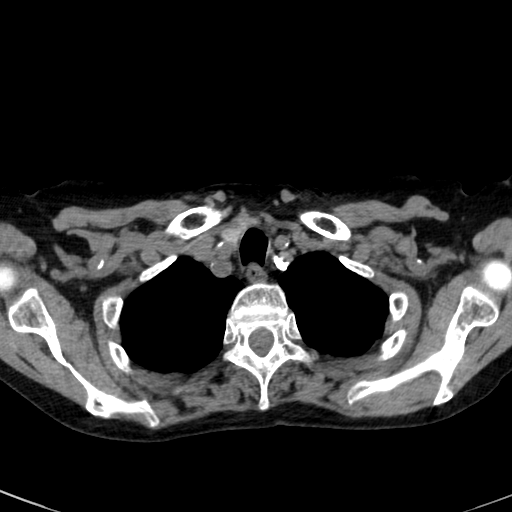
[im 135/159  lung]
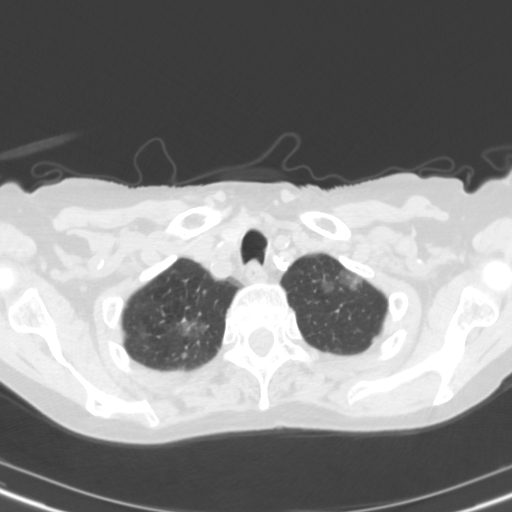
[im 147/159  lung]
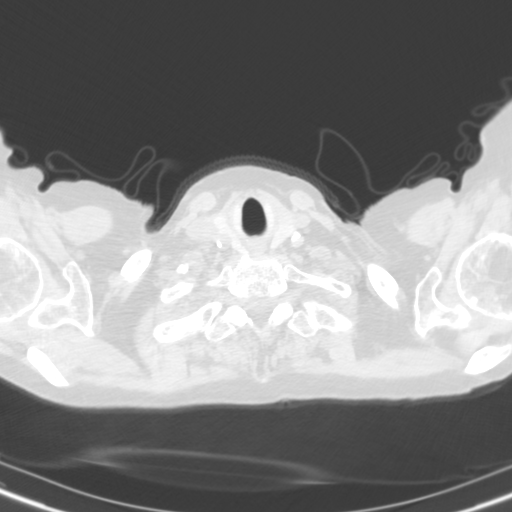

[14 of 34 positions shown; findings below may reference images not displayed]

FINDINGS: Cardiovascular: Heart is normal in size. There is calcified plaque
over the left anterior descending and right coronary arteries.
Moderate calcified plaque over the thoracic aorta. Moderate
calcified plaque at the origin of the left subclavian artery.

Mediastinum/Nodes: 1 cm precarinal lymph node. No definite hilar
adenopathy. Remaining mediastinal structures are unremarkable.

Lungs/Pleura: Lungs are adequately inflated demonstrate patchy
bilateral airspace process right worse than left most notable over
the upper lobes. Possible early cavitation to several of these areas
of airspace opacification. Underlying emphysematous disease is
present. There is a irregular bordered solid mass over the right
lower lobe measuring 3.1 x 3.5 cm. Mild linear density just inferior
to this mass likely atelectasis. Couple small subcentimeter
peripheral nodular densities over the posterior right upper lobe
with the larger measuring 4-5 mm. Discrete 2 mm nodule over the
lateral left upper lobe. Mild narrowing of a right lower lobar
bronchus. Airways are otherwise unremarkable.

Upper Abdomen: Moderate calcified plaque over the abdominal aorta.
Multiple left renal cysts are present

Musculoskeletal: No acute findings.
IMPRESSION: 1. Patchy bilateral multifocal airspace process with prominent
involvement of the upper lungs right worse than left. Possible early
cavitation of this airspace process. Findings likely represent an
acute infectious or inflammatory process. No effusion. 1 cm
precarinal lymph node likely reactive.

2. Irregular right lower lobe mass measuring 3.1 x 3.5 cm with mild
linear atelectasis just inferior to the mass. This is concerning for
primary bronchogenic carcinoma. Few small discrete bilateral lung
nodules with the largest measuring 4-5 mm over the posterior right
upper lobe. Which may be part of the above described presumed acute
infectious/inflammatory process although metastatic disease is
possible. Recommend multidisciplinary thoracic committee
consultation for possible PET-CT or tissue sampling.

3. Aortic Atherosclerosis (8HIN7-MOX.X) and Emphysema ([id number]-[id number].E).
Atherosclerotic coronary artery disease

4.  Several left renal cysts.
